# Patient Record
Sex: Female | Born: 1970 | Race: Black or African American | Hispanic: No | Marital: Married | Smoking: Current every day smoker
Health system: Southern US, Community
[De-identification: ages and names within clinical notes are randomized; demographics above are authoritative.]

---

## 2009-07-28 ENCOUNTER — Emergency Department (HOSPITAL_BASED_OUTPATIENT_CLINIC_OR_DEPARTMENT_OTHER): Admission: EM | Admit: 2009-07-28 | Discharge: 2009-07-28 | Payer: Self-pay | Admitting: Emergency Medicine

## 2009-07-28 ENCOUNTER — Ambulatory Visit: Payer: Self-pay | Admitting: Diagnostic Radiology

## 2010-10-05 IMAGING — CR DG PELVIS 1-2V
1 series · 1 of 1 positions shown · non-contrast
Comparison: None

CLINICAL DATA: Fall, left-sided pain.

PELVIS - 1-2 VIEW

[t pelvis a.p.]
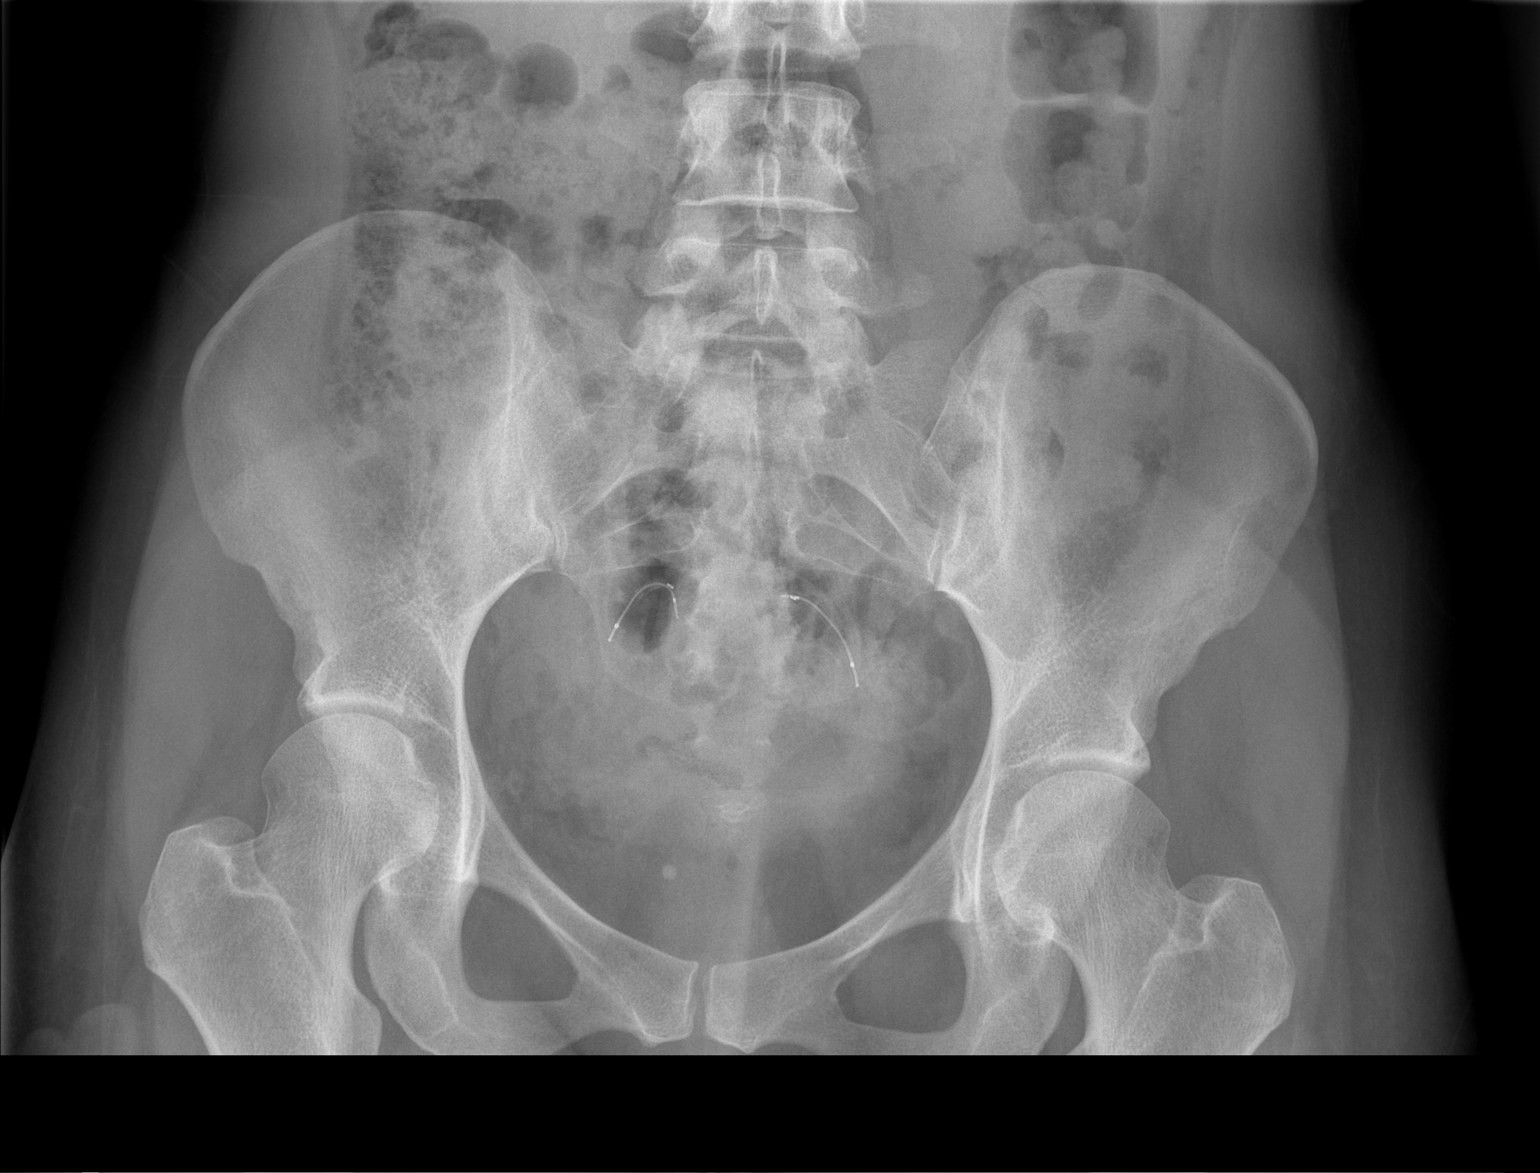

[1 of 1 positions shown; findings below may reference images not displayed]

FINDINGS: No acute bony abnormality.  Specifically, no fracture,
subluxation, or dislocation.  Soft tissues are intact. SI joints
are symmetric. Essure coils are seen within the fallopian tubes.
IMPRESSION: Negative.

## 2010-10-05 IMAGING — CR DG HAND COMPLETE 3+V*L*
3 series · 3 of 3 positions shown · non-contrast
Comparison: None

CLINICAL DATA: Fall, left-sided pain.

LEFT HAND - COMPLETE 3+ VIEW

[x hand pa left]
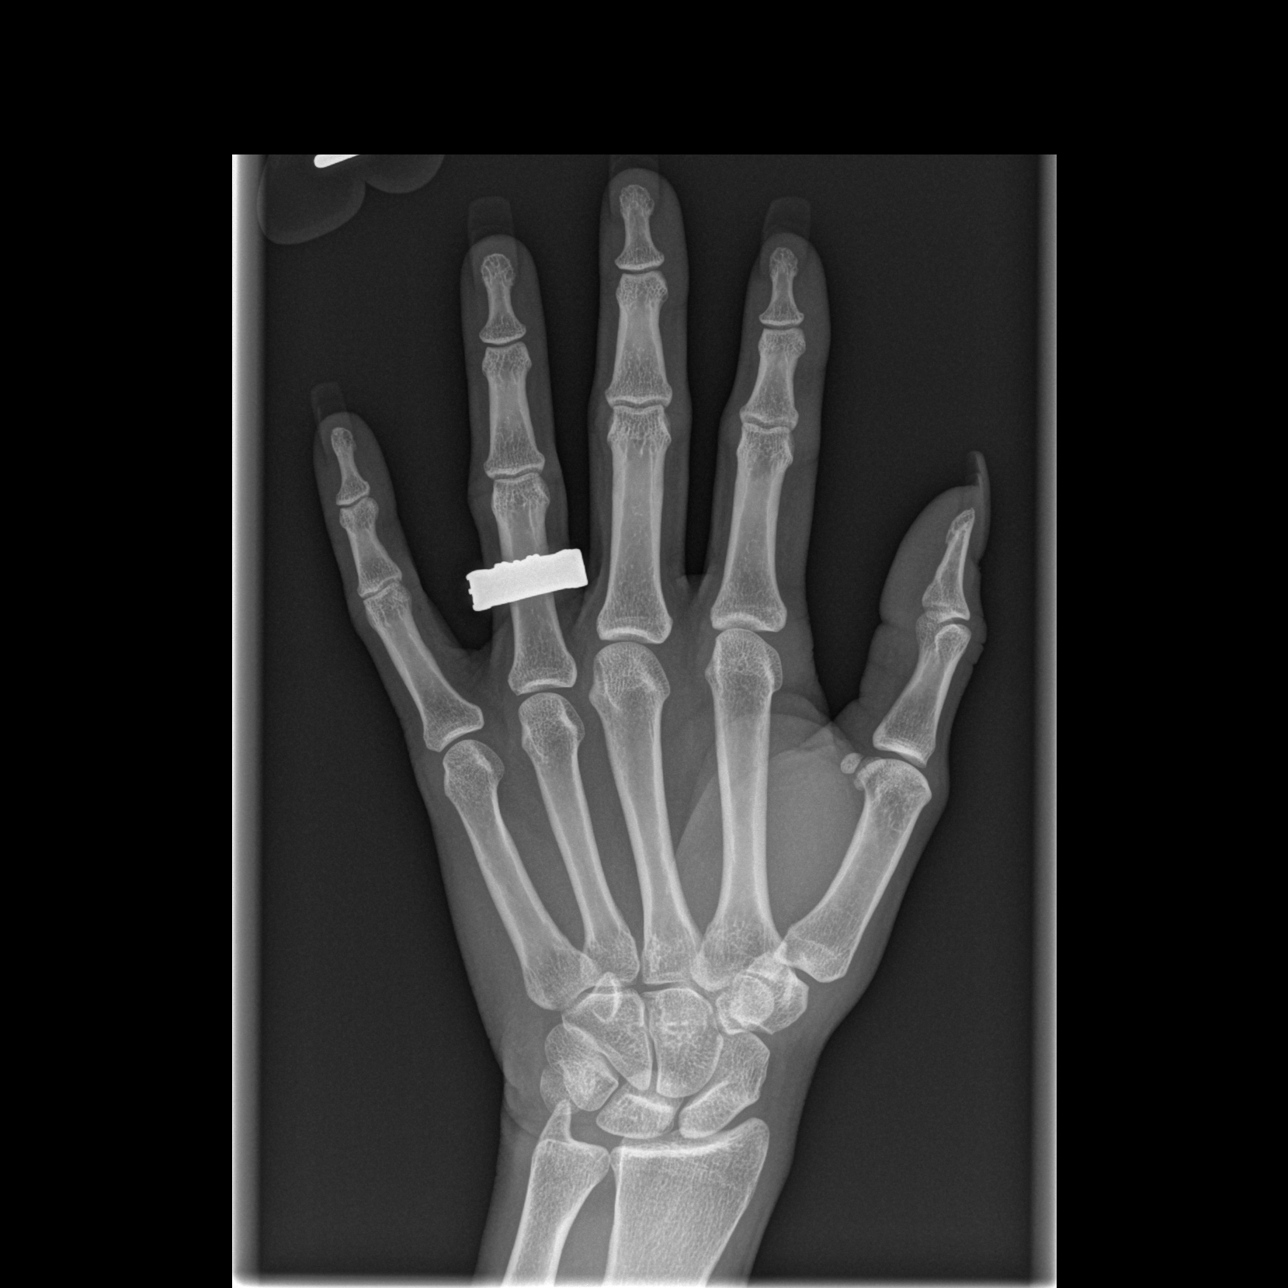

[x hand oblique left]
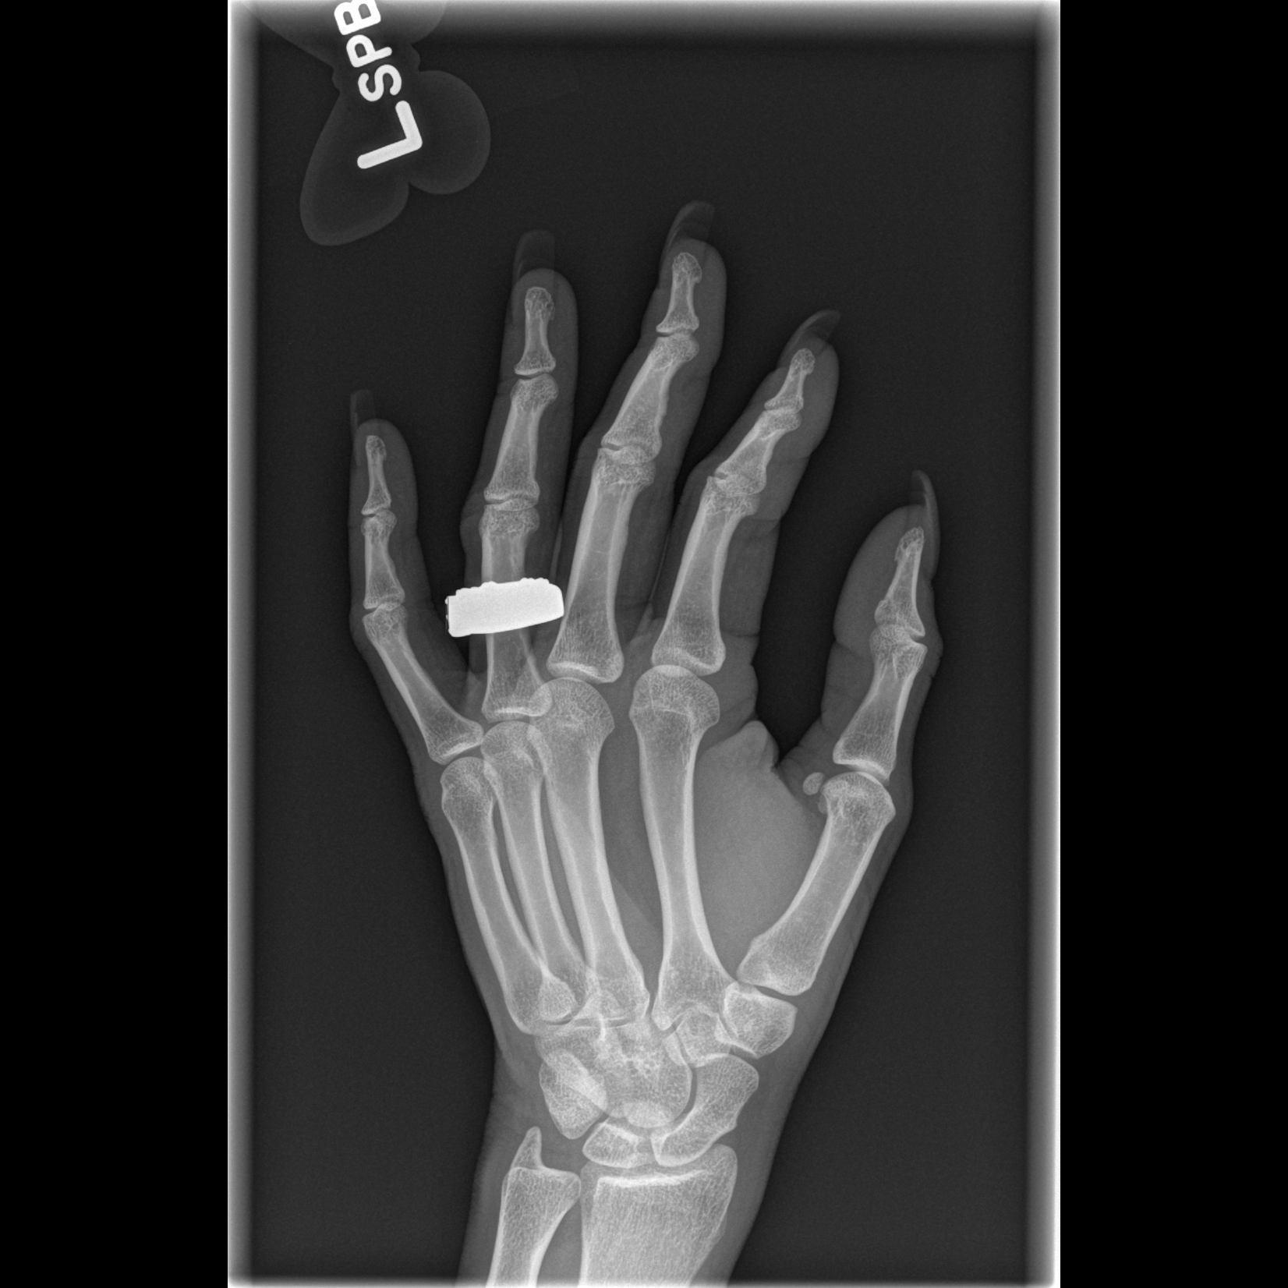

[x hand lat left]
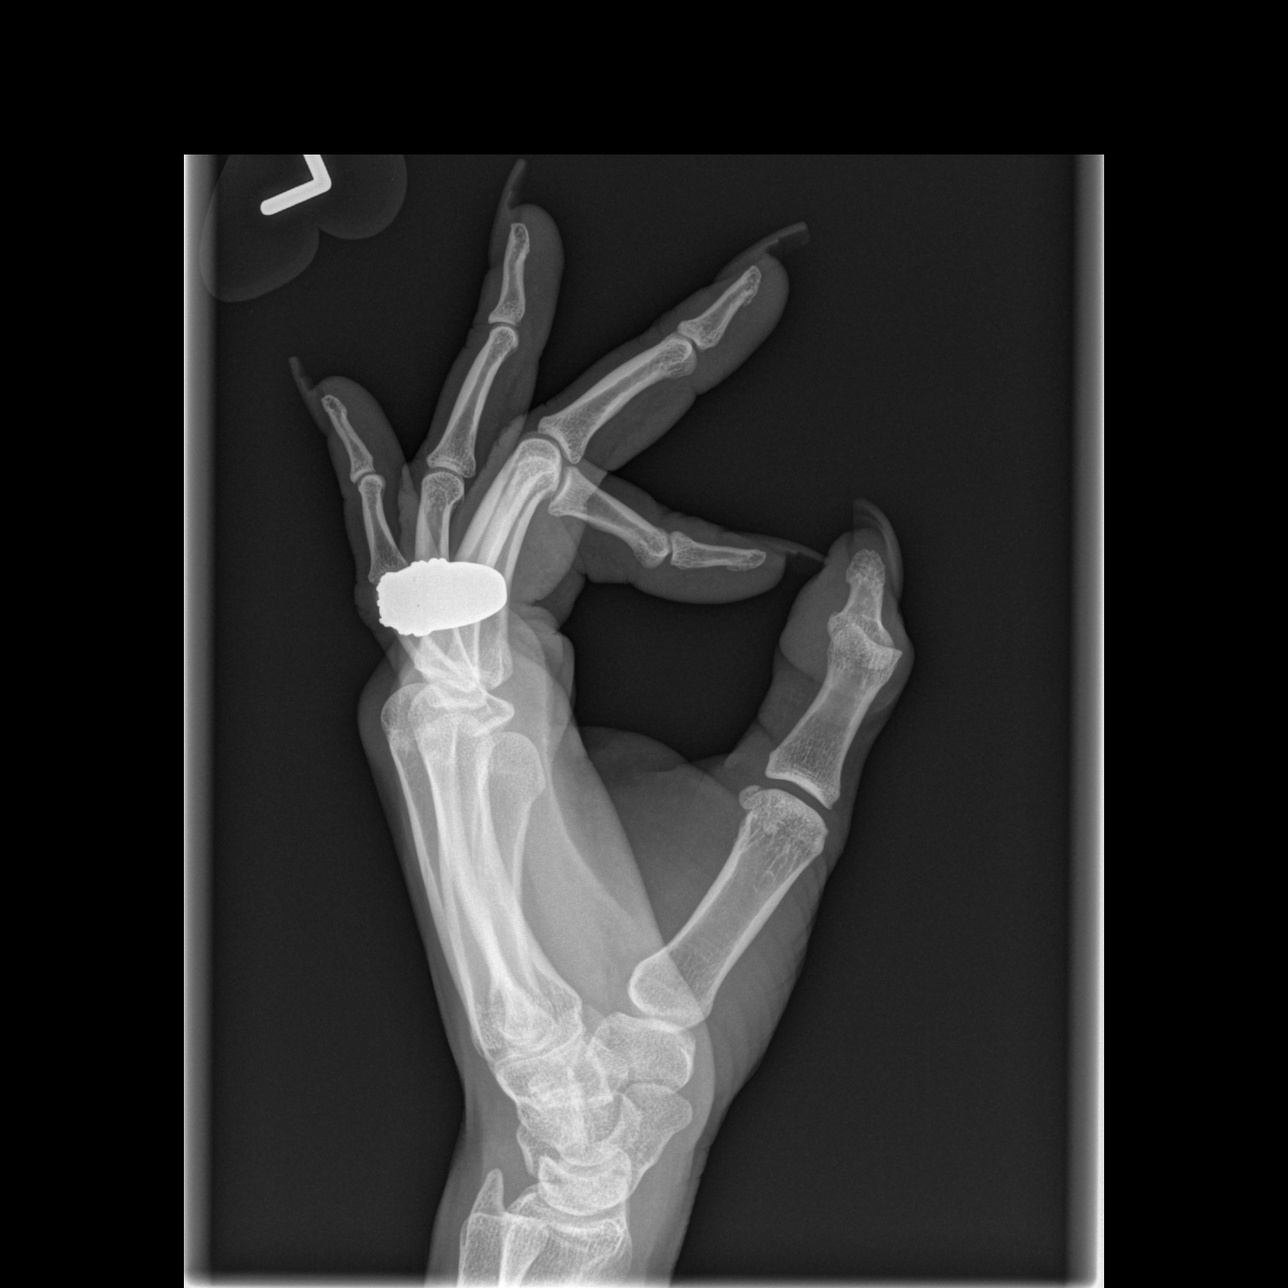

[3 of 3 positions shown; findings below may reference images not displayed]

FINDINGS: No acute bony abnormality.  Specifically, no fracture,
subluxation, or dislocation.  Soft tissues are intact.
IMPRESSION: Negative.

## 2010-10-05 IMAGING — CR DG ELBOW COMPLETE 3+V*L*
4 series · 4 of 4 positions shown · non-contrast
Comparison: None

CLINICAL DATA: Fall, left-sided pain.

LEFT ELBOW - COMPLETE 3+ VIEW

[x elbow joint ap left]
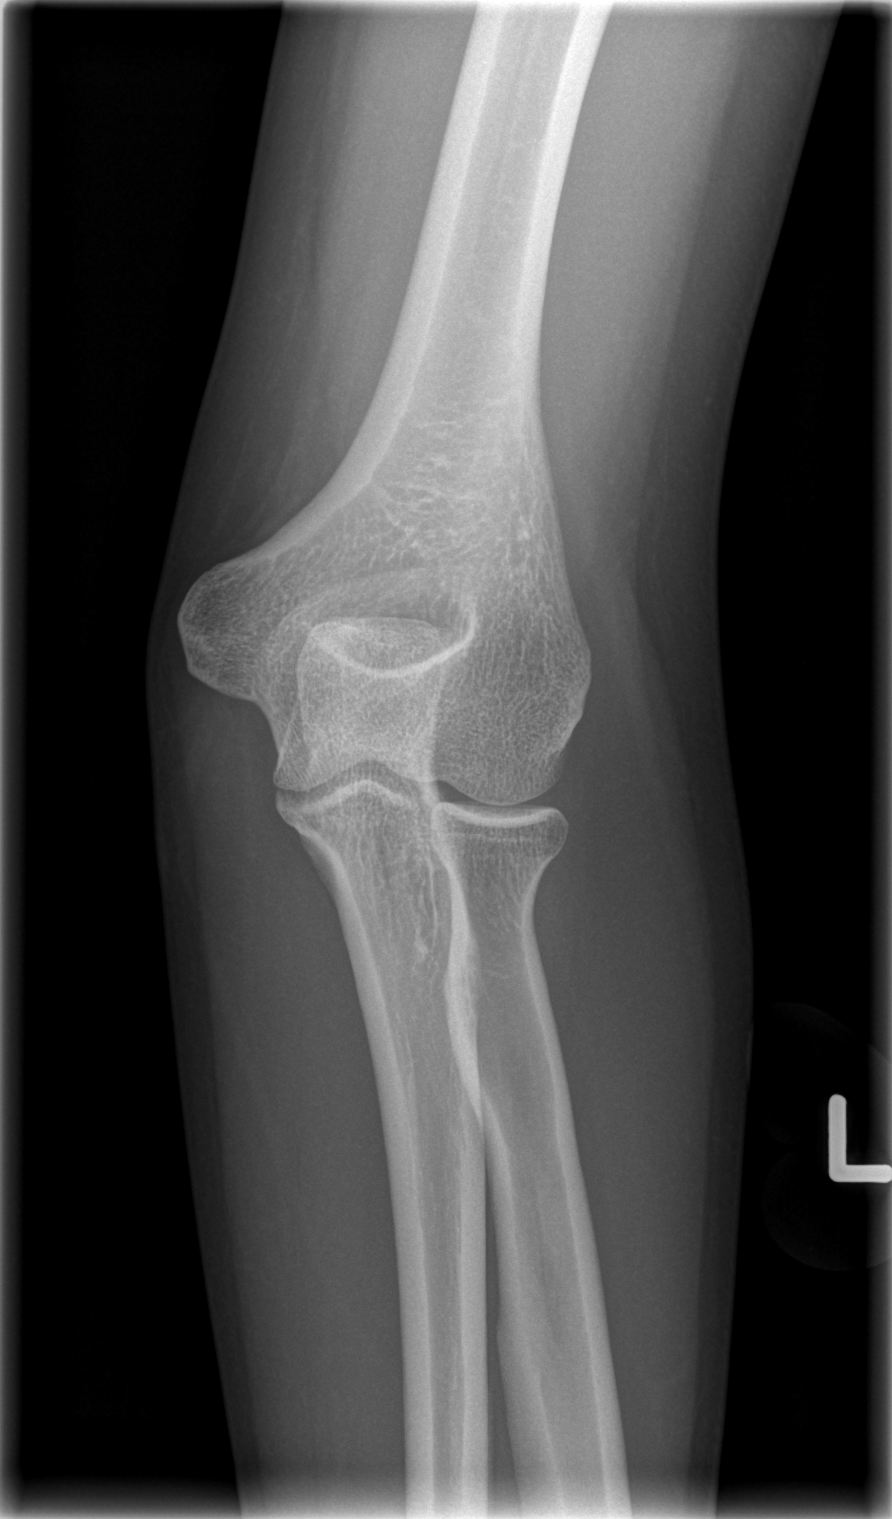

[x elbow joint obl. left (1 of 2)]
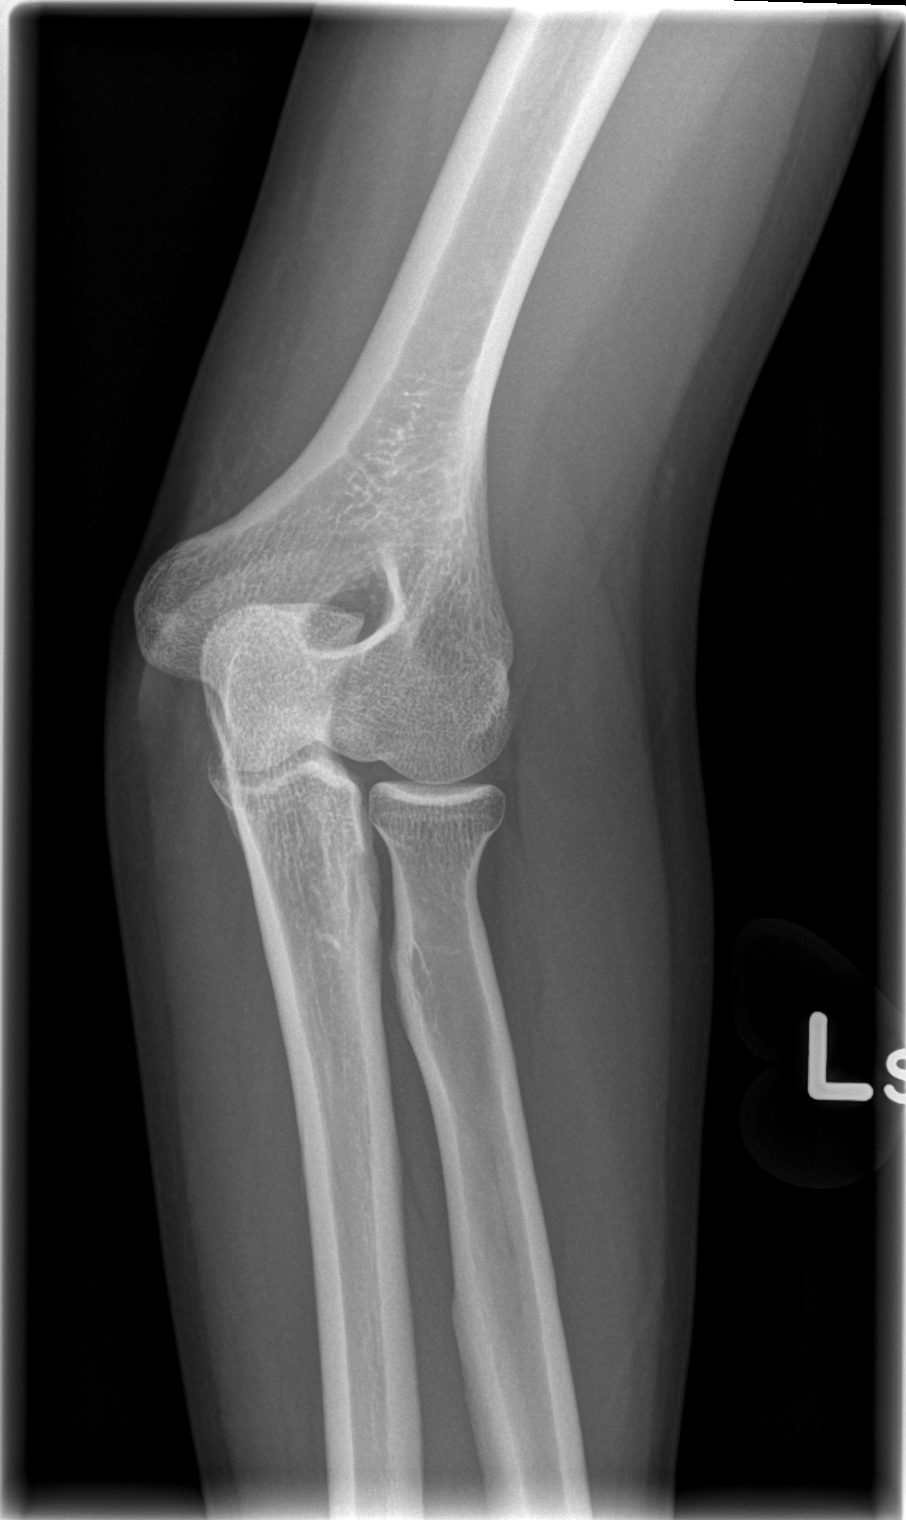

[x elbow joint obl. left (2 of 2)]
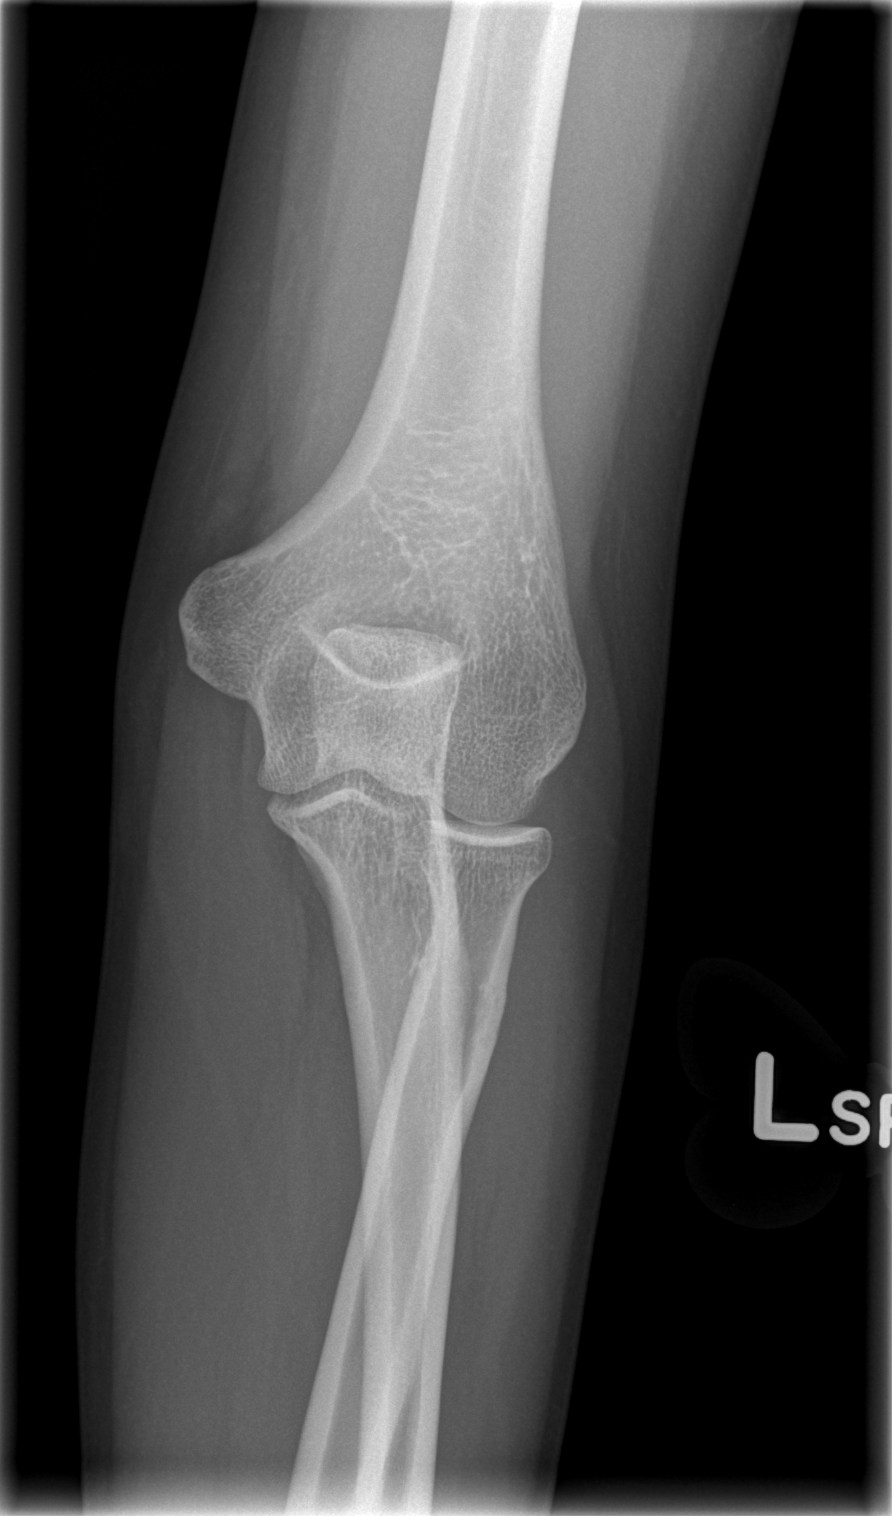

[x elbow joint lat left]
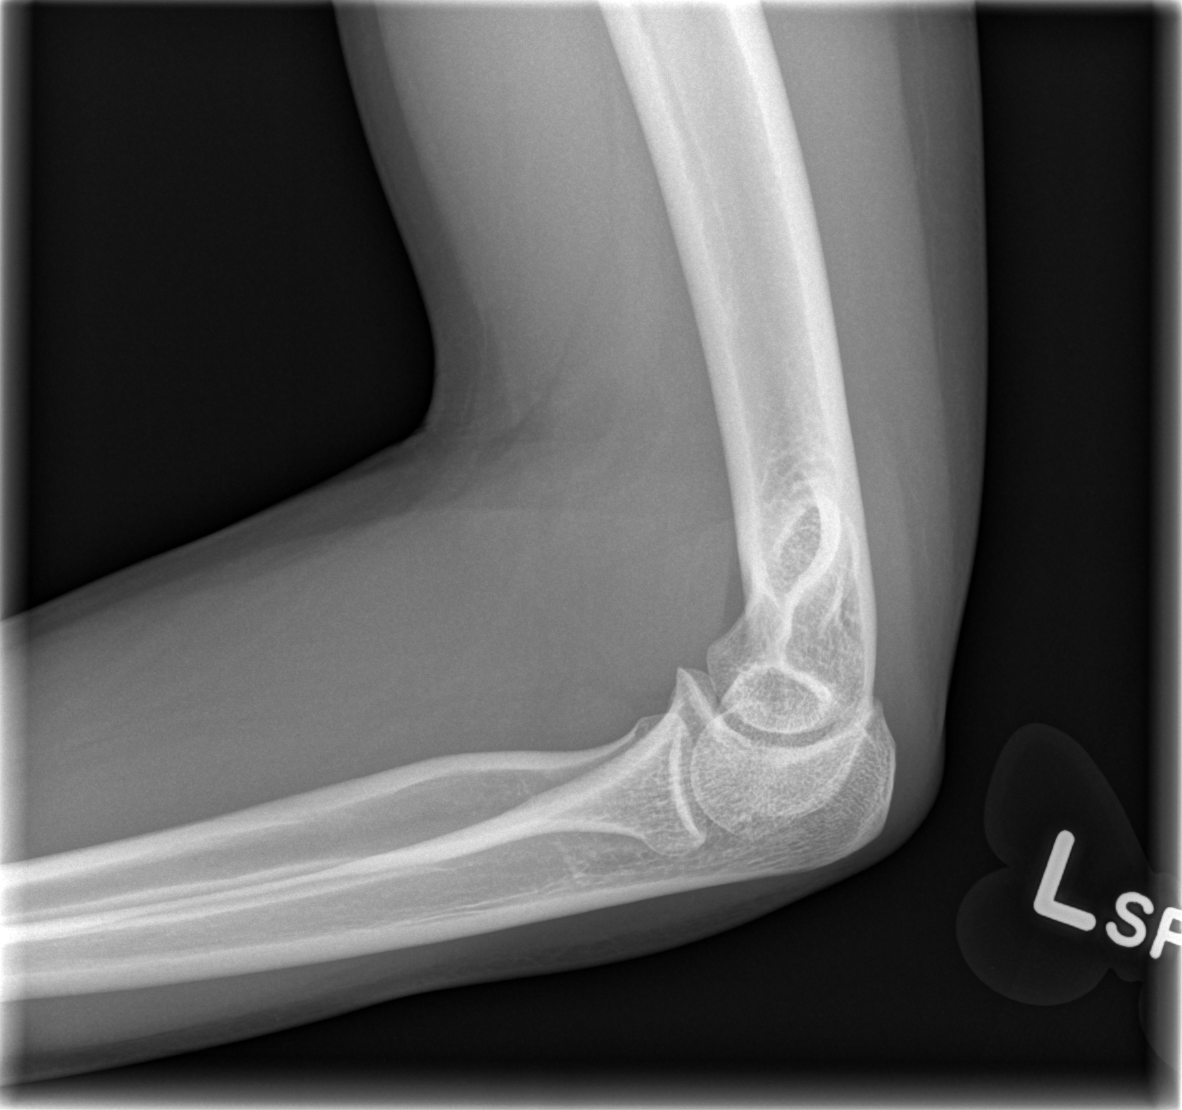

[4 of 4 positions shown; findings below may reference images not displayed]

FINDINGS: No acute bony abnormality.  Specifically, no fracture,
subluxation, or dislocation.  Soft tissues are intact. No joint
effusion.
IMPRESSION: Negative.

## 2011-02-15 LAB — URINALYSIS, ROUTINE W REFLEX MICROSCOPIC
Bilirubin Urine: NEGATIVE
Glucose, UA: NEGATIVE mg/dL
Hgb urine dipstick: NEGATIVE
Ketones, ur: NEGATIVE mg/dL
Specific Gravity, Urine: 1.022 (ref 1.005–1.030)
pH: 6 (ref 5.0–8.0)

## 2018-09-29 ENCOUNTER — Other Ambulatory Visit: Payer: Self-pay

## 2018-09-29 ENCOUNTER — Encounter (HOSPITAL_BASED_OUTPATIENT_CLINIC_OR_DEPARTMENT_OTHER): Payer: Self-pay | Admitting: *Deleted

## 2018-09-29 ENCOUNTER — Emergency Department (HOSPITAL_BASED_OUTPATIENT_CLINIC_OR_DEPARTMENT_OTHER): Payer: Self-pay

## 2018-09-29 ENCOUNTER — Emergency Department (HOSPITAL_BASED_OUTPATIENT_CLINIC_OR_DEPARTMENT_OTHER)
Admission: EM | Admit: 2018-09-29 | Discharge: 2018-09-29 | Disposition: A | Discharge status: Home / Self Care | Payer: Self-pay | Attending: Emergency Medicine | Admitting: Emergency Medicine

## 2018-09-29 DIAGNOSIS — F17200 Nicotine dependence, unspecified, uncomplicated: Secondary | ICD-10-CM | POA: Insufficient documentation

## 2018-09-29 DIAGNOSIS — M25512 Pain in left shoulder: Secondary | ICD-10-CM | POA: Insufficient documentation

## 2018-09-29 DIAGNOSIS — R072 Precordial pain: Secondary | ICD-10-CM | POA: Insufficient documentation

## 2018-09-29 LAB — BASIC METABOLIC PANEL
ANION GAP: 9 (ref 5–15)
BUN: 10 mg/dL (ref 6–20)
CO2: 27 mmol/L (ref 22–32)
Calcium: 9 mg/dL (ref 8.9–10.3)
Chloride: 102 mmol/L (ref 98–111)
Creatinine, Ser: 0.66 mg/dL (ref 0.44–1.00)
GFR calc Af Amer: >60 mL/min (ref >60)
GLUCOSE: 87 mg/dL (ref 70–99)
Potassium: 4.1 mmol/L (ref 3.5–5.1)
Sodium: 138 mmol/L (ref 135–145)

## 2018-09-29 LAB — CBC
HCT: 44.4 % (ref 36.0–46.0)
Hemoglobin: 14.3 g/dL (ref 12.0–15.0)
MCH: 30.5 pg (ref 26.0–34.0)
MCHC: 32.2 g/dL (ref 30.0–36.0)
MCV: 94.7 fL (ref 80.0–100.0)
NRBC: 0 % (ref 0.0–0.2)
PLATELETS: 240 10*3/uL (ref 150–400)
RBC: 4.69 MIL/uL (ref 3.87–5.11)
RDW: 12.8 % (ref 11.5–15.5)
WBC: 7.1 10*3/uL (ref 4.0–10.5)

## 2018-09-29 LAB — TROPONIN I

## 2018-09-29 MED ORDER — KETOROLAC TROMETHAMINE 30 MG/ML IJ SOLN
30.0000 mg | Freq: Once | INTRAMUSCULAR | Status: AC
  Administered 2018-09-29: 30 mg via INTRAVENOUS
  Filled 2018-09-29: qty 1

## 2018-09-29 NOTE — Discharge Instructions (Signed)

## 2018-09-29 NOTE — ED Triage Notes (Signed)
Chest pain last week. States she is in the middle of a stressful divorce. The pain started after dancing and being her son spinning her around. She feels like the pain is a pulled muscle.

## 2018-09-29 NOTE — ED Notes (Signed)
NAD at this time. Pt is stable and going home.  

## 2018-09-29 NOTE — ED Provider Notes (Signed)
Emergency Department Provider Note   I have reviewed the triage vital signs and the nursing notes.   HISTORY  Chief Complaint Chest Pain   HPI Misty Poole is a 47 y.o. female with PMH of tobacco use presents to the emergency department for evaluation of chest and left shoulder pain which began 6 days prior.  The patient states that she her 73 year old son frequently dance together.  They do a maneuver where he spends her and she does not dip and she suspects she may have injured her shoulder during this.  She states that she has had some discomfort and soreness which is worse with moving the left shoulder and radiates to behind the left shoulder and anterior chest.  Pain went away 2 days ago and then returned overnight.  She states the pain woke her from sleep.  She denies any shortness of breath or heart palpitations.  No prior history of CAD.  She does have a positive family history of coronary artery disease.  No fevers or chills.  No numbness or tingling.   History reviewed. No pertinent past medical history.  There are no active problems to display for this patient.   History reviewed. No pertinent surgical history.    Allergies Patient has no known allergies.  No family history on file.  Social History Social History   Tobacco Use  . Smoking status: Current Every Day Smoker  . Smokeless tobacco: Never Used  Substance Use Topics  . Alcohol use: Not Currently  . Drug use: Never    Review of Systems  Constitutional: No fever/chills Eyes: No visual changes. ENT: No sore throat. Cardiovascular: Positive chest pain. Respiratory: Denies shortness of breath. Gastrointestinal: No abdominal pain.  No nausea, no vomiting.  No diarrhea.  No constipation. Genitourinary: Negative for dysuria. Musculoskeletal: Negative for back pain. Positive left shoulder pain.  Skin: Negative for rash. Neurological: Negative for headaches, focal weakness or  numbness.  10-point ROS otherwise negative.  ____________________________________________   PHYSICAL EXAM:  VITAL SIGNS: Vitals:   09/29/18 1645 09/29/18 1700  BP: 120/78 111/77  Pulse: 67 60  Resp: (!) 34 12  SpO2: 98% 99%    Constitutional: Alert and oriented. Well appearing and in no acute distress. Eyes: Conjunctivae are normal.  Head: Atraumatic. Nose: No congestion/rhinnorhea. Mouth/Throat: Mucous membranes are moist.  Oropharynx non-erythematous. Neck: No stridor.   Cardiovascular: Normal rate, regular rhythm. Good peripheral circulation. Grossly normal heart sounds.   Respiratory: Normal respiratory effort.  No retractions. Lungs CTAB. Gastrointestinal: Soft and nontender. No distention.  Musculoskeletal: No lower extremity tenderness nor edema. No gross deformities of extremities. Pain with ROM of the left shoulder and palpation over the anterior shoulder. No elbow tenderness. No tenderness over the anterior chest wall.  Neurologic:  Normal speech and language. No gross focal neurologic deficits are appreciated.  Skin:  Skin is warm, dry and intact. No rash noted.  ____________________________________________   LABS (all labs ordered are listed, but only abnormal results are displayed)  Labs Reviewed  CBC  TROPONIN I  BASIC METABOLIC PANEL   ____________________________________________  EKG  EKG reviewed. Narrow QRS. Normal rate. No STEMI. Normal axis. Sinus rhythm.  ____________________________________________  RADIOLOGY  Dg Chest 2 View  Result Date: 09/29/2018 CLINICAL DATA:  Chest pain last week the feels like a pulled muscle. Current smoker. EXAM: CHEST - 2 VIEW COMPARISON:  PA and lateral chest x-ray of July 28 2009 FINDINGS: The lungs are mildly hyperinflated. There is no focal infiltrate.  The interstitial markings are coarse. There is no pneumothorax or pleural effusion. The heart and pulmonary vascularity are normal. The mediastinum is  normal in width. The bony thorax exhibits no acute abnormality. IMPRESSION: Mild chronic bronchitic-smoking related changes. No acute cardiopulmonary abnormality. Electronically Signed   By: David  SwazilandJordan M.D.   On: 09/29/2018 14:37   Dg Shoulder Left  Result Date: 09/29/2018 CLINICAL DATA:  Left shoulder pain after dancing. EXAM: LEFT SHOULDER - 2+ VIEW COMPARISON:  None. FINDINGS: There is no evidence of fracture or dislocation. There is no evidence of arthropathy or other focal bone abnormality. Soft tissues are unremarkable. IMPRESSION: No fracture or malalignment. Electronically Signed   By: Delbert PhenixJason A Poff M.D.   On: 09/29/2018 16:10    ____________________________________________   PROCEDURES  Procedure(s) performed:   Procedures  None ____________________________________________   INITIAL IMPRESSION / ASSESSMENT AND PLAN / ED COURSE  Pertinent labs & imaging results that were available during my care of the patient were reviewed by me and considered in my medical decision making (see chart for details).  She presents to the emergency department for evaluation of chest pain and left shoulder pain.  Pain appears to be very musculoskeletal.  She has reproducible tenderness to palpation of the left anterior shoulder and pain with range of motion of the joint.  No significant pain at rest or with exertion.  Extremely low suspicion for PE.  She is low risk by Wells and PERC negative.  Despite her palpable tenderness she does have several risk factors for coronary artery disease including  smoking history and family history of CAD.  Plan for screening labs including troponin.  She has had mostly constant pain since early this morning so do not plan for enzyme trending.  Plan for Toradol for pain and reassess.   Patient feeling better after Toradol. Labs, EKG, and plain films reviewed.   At this time, I do not feel there is any life-threatening condition present. I have reviewed and  discussed all results (EKG, imaging, lab, urine as appropriate), exam findings with patient. I have reviewed nursing notes and appropriate previous records.  I feel the patient is safe to be discharged home without further emergent workup. Discussed usual and customary return precautions. Patient and family (if present) verbalize understanding and are comfortable with this plan.  Patient will follow-up with their primary care provider. If they do not have a primary care provider, information for follow-up has been provided to them. All questions have been answered.  ____________________________________________  FINAL CLINICAL IMPRESSION(S) / ED DIAGNOSES  Final diagnoses:  Precordial chest pain  Acute pain of left shoulder     MEDICATIONS GIVEN DURING THIS VISIT:  Medications  ketorolac (TORADOL) 30 MG/ML injection 30 mg (30 mg Intravenous Given 09/29/18 1605)     Note:  This document was prepared using Dragon voice recognition software and may include unintentional dictation errors.  Alona BeneJoshua , MD Emergency Medicine    , Arlyss RepressJoshua G, MD 09/30/18 1250
